# Patient Record
Sex: Male | Born: 1960 | Race: White | Hispanic: No | Marital: Married | Smoking: Never smoker
Health system: Southern US, Community
[De-identification: ages and names within clinical notes are randomized; demographics above are authoritative.]

## PROBLEM LIST (undated history)

## (undated) DIAGNOSIS — I10 Essential (primary) hypertension: Secondary | ICD-10-CM

## (undated) DIAGNOSIS — E669 Obesity, unspecified: Secondary | ICD-10-CM

---

## 2007-09-20 ENCOUNTER — Ambulatory Visit (HOSPITAL_COMMUNITY): Admission: RE | Admit: 2007-09-20 | Discharge: 2007-09-20 | Payer: Self-pay | Admitting: Surgery

## 2007-09-26 ENCOUNTER — Ambulatory Visit (HOSPITAL_COMMUNITY): Admission: RE | Admit: 2007-09-26 | Discharge: 2007-09-26 | Payer: Self-pay | Admitting: Surgery

## 2007-09-26 ENCOUNTER — Encounter: Admission: RE | Admit: 2007-09-26 | Discharge: 2007-09-26 | Payer: Self-pay | Admitting: Surgery

## 2007-11-12 ENCOUNTER — Ambulatory Visit (HOSPITAL_COMMUNITY): Admission: RE | Admit: 2007-11-12 | Discharge: 2007-11-12 | Payer: Self-pay | Admitting: Surgery

## 2007-12-18 ENCOUNTER — Encounter: Admission: RE | Admit: 2007-12-18 | Discharge: 2008-03-17 | Payer: Self-pay | Admitting: Surgery

## 2008-01-01 ENCOUNTER — Ambulatory Visit (HOSPITAL_COMMUNITY): Admission: RE | Admit: 2008-01-01 | Discharge: 2008-01-02 | Payer: Self-pay | Admitting: Surgery

## 2009-04-04 ENCOUNTER — Emergency Department: Payer: Self-pay | Admitting: Unknown Physician Specialty

## 2011-01-11 NOTE — Op Note (Signed)
Justin Floyd, Justin Floyd              ACCOUNT NO.:  [id number]   MEDICAL RECORD NO.:  [id number]          PATIENT TYPE:  OIB   LOCATION:  0098                         FACILITY:  [facility]   PHYSICIAN:  Thornton Park. Daphine Deutscher, MD  DATE OF BIRTH:  12-06-1960   DATE OF PROCEDURE:  01/01/2008  DATE OF DISCHARGE:                               OPERATIVE REPORT   PREOPERATIVE DIAGNOSIS:  Morbid obesity, body mass index 41.   POSTOPERATIVE DIAGNOSIS:  Morbid obesity, body mass index 41.   PROCEDURE:  Lap band APS system with upper endoscopy.   DESCRIPTION OF PROCEDURE:  Justin Floyd is a 50 year old white male from  [place] who was taken to OR #1 and given general anesthesia.  The  abdomen was prepped with Techni-Care and draped sterilely.   I entered the abdomen through the left upper quadrant using the Optiview  technique without difficulty.  Once inside and insufflated, I passed  standard ports including a 15 in the right upper midline, an 11 down  from that, an 11 off to the left the umbilicus with the scope, and then  I got the Nathanson retractor in good position to expose the foregut and  secured it to the iron man.  I went and dissected over on the left crus  first and then went over on the right side and dissected at the fat  stripe.  The band passer was passed from the inferior port on the right,  and it came out and I could see the tip, but I did not like the way it  looked necessarily after I passed the APS tubing when it seemed to meet  some resistance.  I got Dr. Ezzard Standing to endoscope the patient.  He passed  the scope down, and we did not see any evidence of any esophageal or  gastric injury.  I then redirected this a little more inferior, and it  became up again, again seeing the tip of the 10-mm and 11-mm band  passing device.  With that, the endoscope had been withdrawn into the  proximal esophagus, so we went ahead and used that location.  The APS  band was then inserted into the  abdomen, threaded through the band  passer, and brought around.  It was then snapped in place and held down  toward the right foot.  Within that location, I was able to plicate the  stomach over the band anteriorly to the proximal small virtual pouch,  and that was done using 2-0 Surgidac and tie knots.  There was a little  bleeding that occurred with a second suture, but that abated, and I  irrigated well.  I had also immersed the esophagogastric junction under  saline while Dr. Ezzard Standing was endoscoping to look for bubbles, and none  were seen.   Dr. Ezzard Standing then went up above again and passed the scope, and we were  able to see the mucosa and the proximal esophagus, the distal esophagus  across the EG junction, and on into the stomach as the scope passed  easily.  We withdrew the air.  There was no  evidence of any mucosal  injury or any thing at all.  The abdomen was deflated and the scope was  withdrawn.  I then brought the tubing out and brought it to the right  lower port.  I cut off the tip and then allowed the excess fluid to  escape before connecting it to the port.  This was then sutured to the  fascia with interrupted 2-0 Prolenes spaced out and then directing it  medially down on the fascia.   The patient tolerated the procedure well.  The wounds were all  irrigated, injected with Marcaine, and closed with 4-0 Vicryl with  Benzoin and Steri-Strips.  The patient was taken to the recovery room in  satisfactory condition.      Thornton Park Daphine Deutscher, MD  Electronically Signed     MBM/MEDQ  D:  01/01/2008  T:  01/01/2008  Job:  [id number]   cc:   Justin Floyd  Fax: [phone]

## 2011-07-05 ENCOUNTER — Telehealth (INDEPENDENT_AMBULATORY_CARE_PROVIDER_SITE_OTHER): Payer: Self-pay | Admitting: Surgery

## 2011-07-05 NOTE — Telephone Encounter (Signed)
07/05/11 recall mailed for bariatric surgery follow-up. Adv pt to call [facility] to schedule an appt...cef °

## 2012-08-30 ENCOUNTER — Telehealth (INDEPENDENT_AMBULATORY_CARE_PROVIDER_SITE_OTHER): Payer: Self-pay | Admitting: Surgery

## 2012-08-30 NOTE — Telephone Encounter (Signed)
07/25/12 mailed recall letter for bariatric surgery follow-up to pt. Advised pt to call [facility] at [phone] to °schedule appt. (lss) ° °

## 2016-08-02 ENCOUNTER — Encounter (HOSPITAL_COMMUNITY): Payer: Self-pay

## 2016-08-09 ENCOUNTER — Telehealth (HOSPITAL_COMMUNITY): Payer: Self-pay

## 2016-08-09 NOTE — Telephone Encounter (Signed)
This patient is overdue for recommended follow-up with a bariatric surgeon at [facility]. A letter was mailed to the address on file 08/02/16 from both [facility] in attempt to reestablish post-op care. Letter has been returned to [employer] marked undeliverable, unable to forward. No additional address on file in [facility] or [employer]. Information was shared with Mallory Shirk today at [place] so she may contact the patient via phone in attempt to get the patient scheduled for an appointment in their office.

## 2016-10-14 ENCOUNTER — Encounter: Payer: Self-pay | Admitting: *Deleted

## 2016-10-17 ENCOUNTER — Ambulatory Visit
Admission: RE | Admit: 2016-10-17 | Discharge: 2016-10-17 | Disposition: A | Discharge status: Home / Self Care | Payer: BC Managed Care – PPO | Source: Ambulatory Visit | Attending: Gastroenterology | Admitting: Gastroenterology

## 2016-10-17 ENCOUNTER — Encounter
Admission: RE | Disposition: A | Discharge status: Home / Self Care | Payer: Self-pay | Source: Ambulatory Visit | Attending: Gastroenterology

## 2016-10-17 ENCOUNTER — Ambulatory Visit: Payer: BC Managed Care – PPO | Admitting: Anesthesiology

## 2016-10-17 DIAGNOSIS — I10 Essential (primary) hypertension: Secondary | ICD-10-CM | POA: Diagnosis not present

## 2016-10-17 DIAGNOSIS — E669 Obesity, unspecified: Secondary | ICD-10-CM | POA: Diagnosis not present

## 2016-10-17 DIAGNOSIS — Z880 Allergy status to penicillin: Secondary | ICD-10-CM | POA: Diagnosis not present

## 2016-10-17 DIAGNOSIS — Z1211 Encounter for screening for malignant neoplasm of colon: Secondary | ICD-10-CM | POA: Insufficient documentation

## 2016-10-17 DIAGNOSIS — K621 Rectal polyp: Secondary | ICD-10-CM | POA: Insufficient documentation

## 2016-10-17 DIAGNOSIS — D120 Benign neoplasm of cecum: Secondary | ICD-10-CM | POA: Insufficient documentation

## 2016-10-17 DIAGNOSIS — Z6836 Body mass index (BMI) 36.0-36.9, adult: Secondary | ICD-10-CM | POA: Insufficient documentation

## 2016-10-17 HISTORY — DX: Obesity, unspecified: E66.9

## 2016-10-17 HISTORY — DX: Essential (primary) hypertension: I10

## 2016-10-17 HISTORY — PX: COLONOSCOPY WITH PROPOFOL: SHX5780

## 2016-10-17 SURGERY — COLONOSCOPY WITH PROPOFOL
Anesthesia: General

## 2016-10-17 MED ORDER — SODIUM CHLORIDE 0.9 % IV SOLN: INTRAVENOUS | Status: DC

## 2016-10-17 MED ORDER — PROPOFOL 10 MG/ML IV BOLUS
INTRAVENOUS | Status: DC | PRN
  Administered 2016-10-17: 30 mg via INTRAVENOUS
  Administered 2016-10-17: 70 mg via INTRAVENOUS

## 2016-10-17 MED ORDER — LIDOCAINE HCL (PF) 2 % IJ SOLN
INTRAMUSCULAR | Status: DC | PRN
  Administered 2016-10-17: 80 mg via INTRADERMAL

## 2016-10-17 MED ORDER — LIDOCAINE HCL (PF) 2 % IJ SOLN
INTRAMUSCULAR | Status: AC
  Filled 2016-10-17: qty 2

## 2016-10-17 MED ORDER — PROPOFOL 500 MG/50ML IV EMUL
INTRAVENOUS | Status: DC | PRN
  Administered 2016-10-17: 125 ug/kg/min via INTRAVENOUS

## 2016-10-17 MED ORDER — SODIUM CHLORIDE 0.9 % IV SOLN
INTRAVENOUS | Status: DC
  Administered 2016-10-17: 13:00:00 via INTRAVENOUS

## 2016-10-17 MED ORDER — PROPOFOL 10 MG/ML IV BOLUS
INTRAVENOUS | Status: AC
  Filled 2016-10-17: qty 20

## 2016-10-17 NOTE — Anesthesia Post-op Follow-up Note (Cosign Needed)
Anesthesia QCDR form completed.        

## 2016-10-17 NOTE — Transfer of Care (Signed)
Immediate Anesthesia Transfer of Care Note  Patient: Justin Floyd  Procedure(s) Performed: Procedure(s): COLONOSCOPY WITH PROPOFOL (N/A)  Patient Location: PACU  Anesthesia Type:General  Level of Consciousness: sedated and responds to stimulation  Airway & Oxygen Therapy: Patient Spontanous Breathing and Patient connected to nasal cannula oxygen  Post-op Assessment: Report given to RN and Post -op Vital signs reviewed and stable  Post vital signs: Reviewed and stable  Last Vitals:  Vitals:   10/17/16 1253 10/17/16 1429  BP: (!) 172/97 130/83  Pulse: 73 68  Resp: 20 16  Temp: 37.2 C     Last Pain:  Vitals:   10/17/16 1253  TempSrc: Tympanic         Complications: No apparent anesthesia complications

## 2016-10-17 NOTE — Anesthesia Procedure Notes (Signed)
Performed by: Dior Dominik Pre-anesthesia Checklist: Patient identified, Emergency Drugs available, Suction available, Patient being monitored and Timeout performed Patient Re-evaluated:Patient Re-evaluated prior to inductionOxygen Delivery Method: Nasal cannula Preoxygenation: Pre-oxygenation with 100% oxygen Intubation Type: IV induction       

## 2016-10-17 NOTE — Anesthesia Postprocedure Evaluation (Signed)
Anesthesia Post Note  Patient: Justin Floyd  Procedure(s) Performed: Procedure(s) (LRB): COLONOSCOPY WITH PROPOFOL (N/A)  Patient location during evaluation: Endoscopy Anesthesia Type: General Level of consciousness: awake and alert and oriented Pain management: pain level controlled Vital Signs Assessment: post-procedure vital signs reviewed and stable Respiratory status: spontaneous breathing, nonlabored ventilation and respiratory function stable Cardiovascular status: blood pressure returned to baseline and stable Postop Assessment: no signs of nausea or vomiting Anesthetic complications: no     Last Vitals:  Vitals:   10/17/16 1448 10/17/16 1458  BP: (!) 147/92 (!) 159/90  Pulse: 66 68  Resp: 13 16  Temp:      Last Pain:  Vitals:   10/17/16 1428  TempSrc: Tympanic  PainSc: Asleep                 [name]

## 2016-10-17 NOTE — H&P (Signed)
Outpatient short stay form Pre-procedure 10/17/2016 1:47 PM Lollie Sails MD  Primary Physician: Dr Meriel Flavors  Reason for visit:  Colonoscopy  History of present illness:  Patient is a 56 year old male presenting today as above. He has not had a colonoscopy in the past. This is for screening purposes. He tolerated his prep well. He takes no aspirin or blood thinning agents.    Current Facility-Administered Medications:  .  0.9 %  sodium chloride infusion, , Intravenous, Continuous, Lollie Sails, MD .  0.9 %  sodium chloride infusion, , Intravenous, Continuous, Lollie Sails, MD  Prescriptions Prior to Admission  Medication Sig Dispense Refill Last Dose  . lisinopril-hydrochlorothiazide (PRINZIDE,ZESTORETIC) 20-12.5 MG tablet Take 1 tablet by mouth daily.     . Testosterone 12.5 MG/ACT (1%) GEL Place onto the skin.        Allergies  Allergen Reactions  . Penicillins Itching     Past Medical History:  Diagnosis Date  . Hypertension   . Obesity     Review of systems:      Physical Exam    Heart and lungs: Regular rate and rhythm without rub or gallop, lungs are bilaterally clear.    HEENT: Normocephalic atraumatic eyes are anicteric    Other:     Pertinant exam for procedure: Soft nontender nondistended bowel sounds positive normoactive. Note there is a well-healed incision in the epigastric region consistent with his previous history of lap band.    Planned proceedures: Colonoscopy and indicated procedures. I have discussed the risks benefits and complications of procedures to include not limited to bleeding, infection, perforation and the risk of sedation and the patient wishes to proceed.    Lollie Sails, MD Gastroenterology 10/17/2016  1:47 PM

## 2016-10-17 NOTE — OR Nursing (Addendum)
Both cold snare polypectomy site edges cleaned up with cold forcep.

## 2016-10-17 NOTE — Op Note (Signed)
[facility] Gastroenterology Patient Name: Justin Floyd Procedure Date: 10/17/2016 1:50 PM MRN: [id number] Account #: [id number] Date of Birth: 1961/08/23 Admit Type: Outpatient Age: 56 Room: [facility] ENDO ROOM 3 Gender: Male Note Status: Finalized Procedure:            Colonoscopy Indications:          Screening for colorectal malignant neoplasm Providers:            Lollie Sails, MD Referring MD:         Dion Body (Referring MD) Medicines:            Monitored Anesthesia Care Complications:        No immediate complications. Procedure:            Pre-Anesthesia Assessment:                       - ASA Grade Assessment: II - A patient with mild                        systemic disease.                       After obtaining informed consent, the colonoscope was                        passed under direct vision. Throughout the procedure,                        the patient's blood pressure, pulse, and oxygen                        saturations were monitored continuously. The                        Colonoscope was introduced through the anus and                        advanced to the the cecum, identified by appendiceal                        orifice and ileocecal valve. The colonoscopy was                        performed without difficulty. The patient tolerated the                        procedure well. The quality of the bowel preparation                        was fair. Findings:      A 8 mm polyp was found in the cecum. The polyp was sessile. The polyp       was removed with a cold snare and cold forcep. Resection and retrieval       were complete.      A 7 mm polyp was found in the rectum. The polyp was sessile. The polyp       was removed with a cold biopsy forceps. Resection and retrieval were       complete.      The digital rectal exam was normal.      The retroflexed view of the distal  rectum and anal verge was normal and       showed no  anal or rectal abnormalities. Impression:           - Preparation of the colon was fair.                       - One 8 mm polyp in the cecum, removed with a cold                        snare. Resected and retrieved.                       - One 7 mm polyp in the rectum, removed with a cold                        biopsy forceps. Resected and retrieved.                       - The distal rectum and anal verge are normal on                        retroflexion view. Recommendation:       - Discharge patient to home. Procedure Code(s):    --- Professional ---                       [phone], Colonoscopy, flexible; with removal of tumor(s),                        polyp(s), or other lesion(s) by snare technique                       45380, 100, Colonoscopy, flexible; with biopsy, single                        or multiple Diagnosis Code(s):    --- Professional ---                       Z12.11, Encounter for screening for malignant neoplasm                        of colon                       D12.0, Benign neoplasm of cecum                       K62.1, Rectal polyp CPT copyright 2016 American Medical Association. All rights reserved. The codes documented in this report are preliminary and upon coder review may  be revised to meet current compliance requirements. Lollie Sails, MD 10/17/2016 2:28:07 PM This report has been signed electronically. Number of Addenda: 0 Note Initiated On: 10/17/2016 1:50 PM Scope Withdrawal Time: 0 hours 19 minutes 13 seconds  Total Procedure Duration: 0 hours 26 minutes 14 seconds       [facility]

## 2016-10-17 NOTE — Anesthesia Preprocedure Evaluation (Signed)
Anesthesia Evaluation  Patient identified by MRN, date of birth, ID band Patient awake    Reviewed: Allergy & Precautions, NPO status , Patient's Chart, lab work & pertinent test results  History of Anesthesia Complications Negative for: history of anesthetic complications  Airway Mallampati: IV  TM Distance: >3 FB Neck ROM: Full    Dental no notable dental hx.    Pulmonary neg pulmonary ROS, neg sleep apnea, neg COPD,    breath sounds clear to auscultation- rhonchi (-) wheezing      Cardiovascular Exercise Tolerance: Good hypertension, Pt. on medications (-) CAD and (-) Past MI  Rhythm:Regular Rate:Normal - Systolic murmurs and - Diastolic murmurs    Neuro/Psych negative neurological ROS  negative psych ROS   GI/Hepatic negative GI ROS, Neg liver ROS,   Endo/Other  negative endocrine ROSneg diabetes  Renal/GU negative Renal ROS     Musculoskeletal negative musculoskeletal ROS (+)   Abdominal (+) + obese,   Peds  Hematology negative hematology ROS (+)   Anesthesia Other Findings Past Medical History: No date: Hypertension No date: Obesity   Reproductive/Obstetrics                             Anesthesia Physical Anesthesia Plan  ASA: II  Anesthesia Plan: General   Post-op Pain Management:    Induction: Intravenous  Airway Management Planned: Natural Airway  Additional Equipment:   Intra-op Plan:   Post-operative Plan:   Informed Consent: I have reviewed the patients History and Physical, chart, labs and discussed the procedure including the risks, benefits and alternatives for the proposed anesthesia with the patient or authorized representative who has indicated his/her understanding and acceptance.   Dental advisory given  Plan Discussed with: CRNA and Anesthesiologist  Anesthesia Plan Comments:         Anesthesia Quick Evaluation

## 2016-10-18 ENCOUNTER — Encounter: Payer: Self-pay | Admitting: Gastroenterology

## 2016-10-19 LAB — SURGICAL PATHOLOGY

## 2017-05-22 ENCOUNTER — Other Ambulatory Visit: Payer: Self-pay | Admitting: Neurosurgery

## 2017-05-22 DIAGNOSIS — M5440 Lumbago with sciatica, unspecified side: Secondary | ICD-10-CM

## 2017-05-24 ENCOUNTER — Other Ambulatory Visit: Payer: Self-pay | Admitting: Neurosurgery

## 2017-05-24 DIAGNOSIS — M545 Low back pain: Principal | ICD-10-CM

## 2017-05-24 DIAGNOSIS — G8929 Other chronic pain: Secondary | ICD-10-CM

## 2017-05-25 ENCOUNTER — Ambulatory Visit: Payer: BC Managed Care – PPO

## 2017-06-03 ENCOUNTER — Ambulatory Visit
Admission: RE | Admit: 2017-06-03 | Discharge: 2017-06-03 | Disposition: A | Discharge status: Home / Self Care | Payer: BC Managed Care – PPO | Source: Ambulatory Visit | Attending: Neurosurgery | Admitting: Neurosurgery

## 2017-06-03 DIAGNOSIS — M545 Low back pain: Principal | ICD-10-CM

## 2017-06-03 DIAGNOSIS — G8929 Other chronic pain: Secondary | ICD-10-CM

## 2017-08-03 ENCOUNTER — Encounter (HOSPITAL_COMMUNITY): Payer: Self-pay

## 2019-01-10 IMAGING — MR MR LUMBAR SPINE W/O CM
4 of 5 series · 26 of 48 positions shown · non-contrast
Comparison: Lumbar spine radiograph 04/25/2017

CLINICAL DATA: Chronic low back pain

EXAM:
MRI LUMBAR SPINE WITHOUT CONTRAST
TECHNIQUE: Multiplanar, multisequence MR imaging of the lumbar spine was
performed. No intravenous contrast was administered.

[Series 3: T2 post-contrast · sagittal · 4.0mm · 0.55mm/px · 6 of 13 slices shown]
[im 1/13]
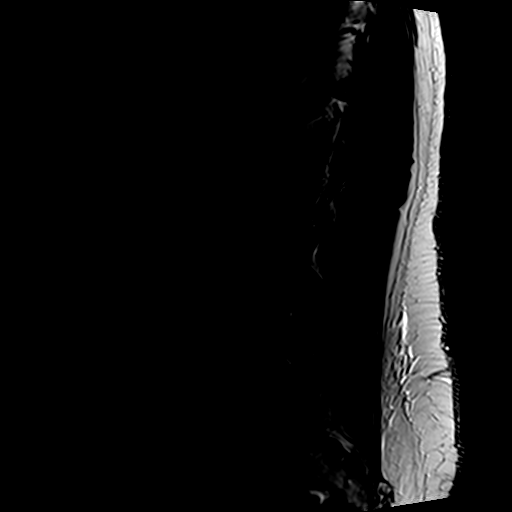
[im 3/13]
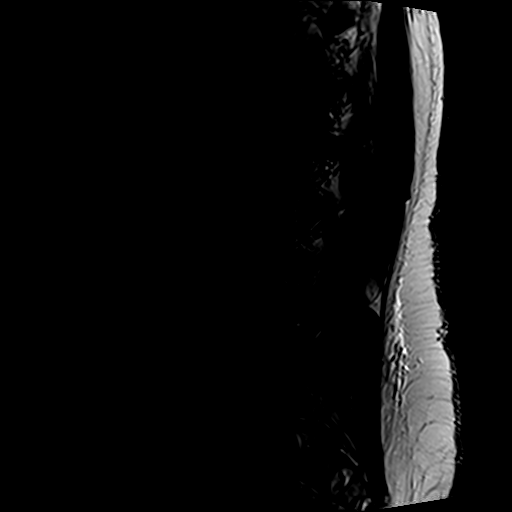
[im 5/13]
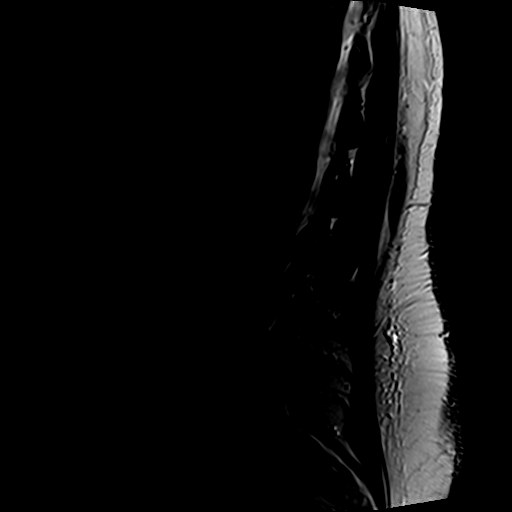
[im 8/13]
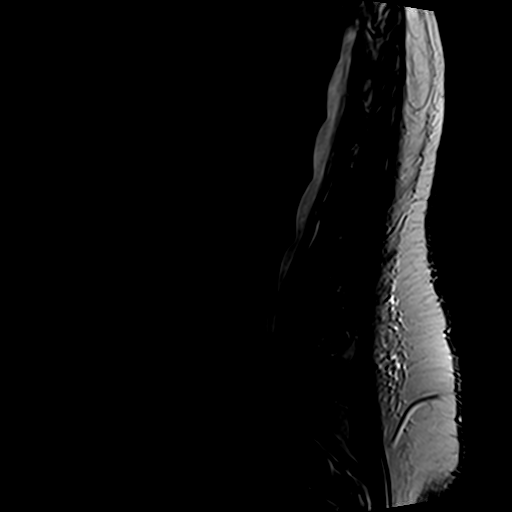
[im 10/13]
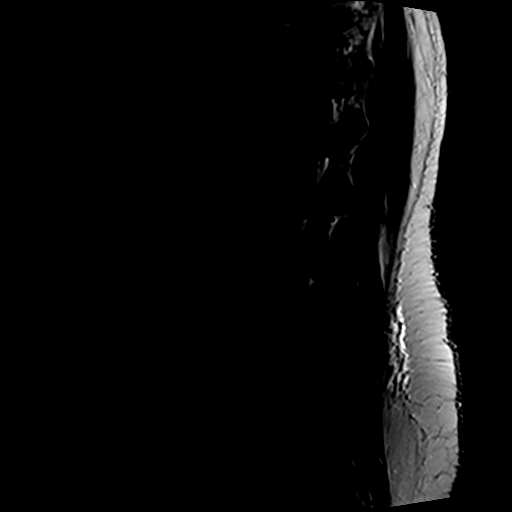
[im 13/13]
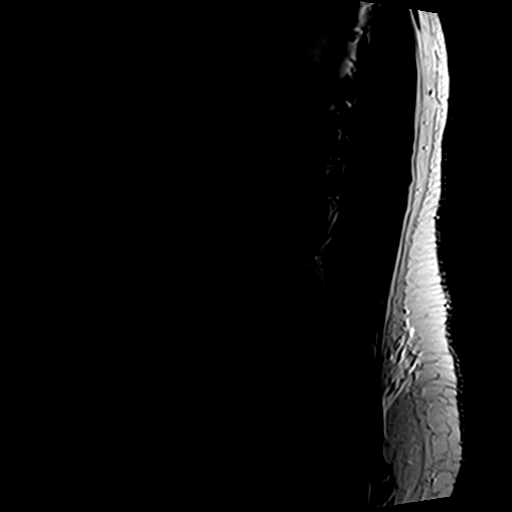

[Series 5: T1 · sagittal · 4.0mm · 0.55mm/px · 6 of 13 slices shown (1 of 2)]
[im 1/13]
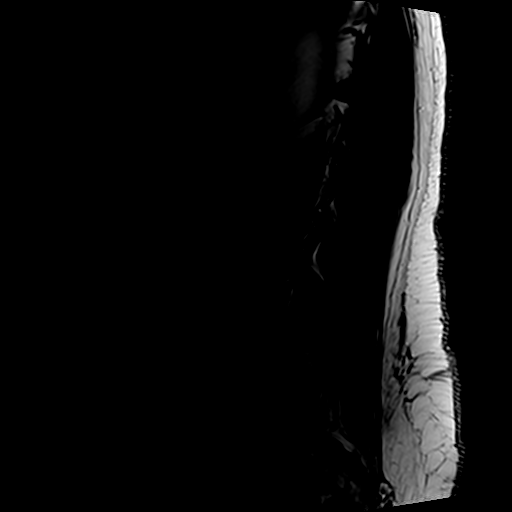
[im 3/13]
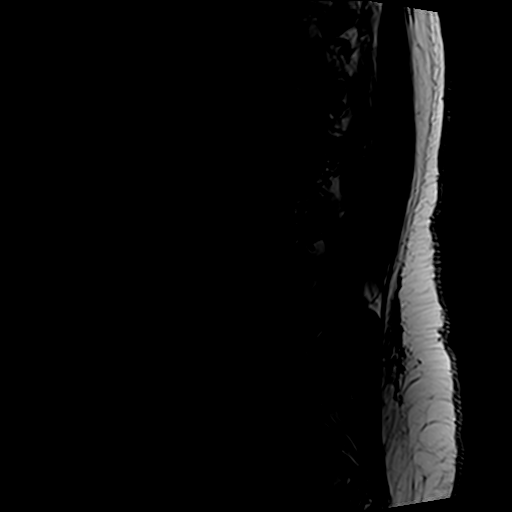
[im 5/13]
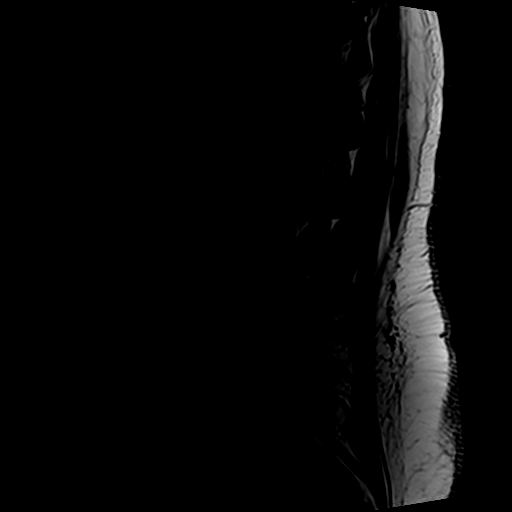
[im 8/13]
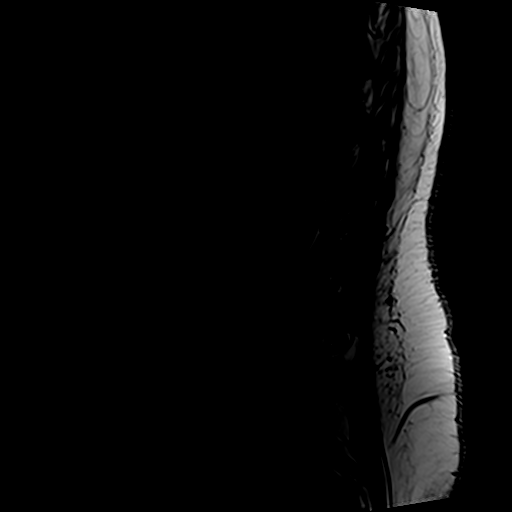
[im 10/13]
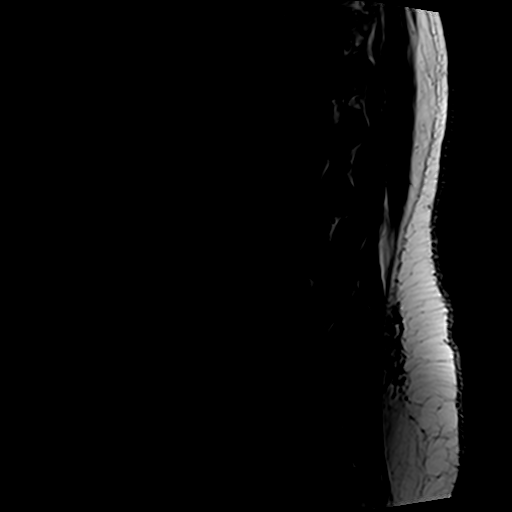
[im 13/13]
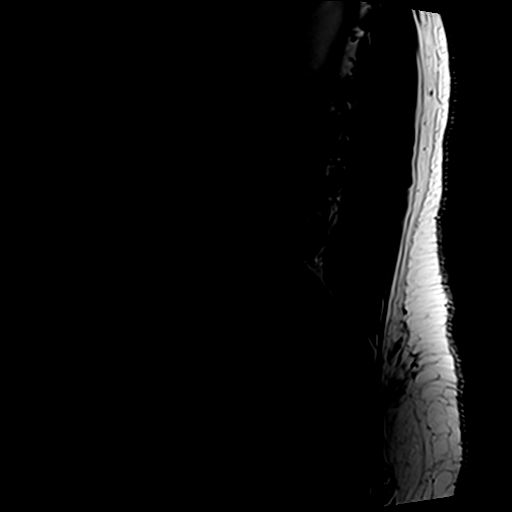

[Series 6: T1 · axial · 4.0mm · 0.35mm/px · z∈[-77,+91]mm · 5 of 35 slices shown (2 of 2)]
[im 1/35]
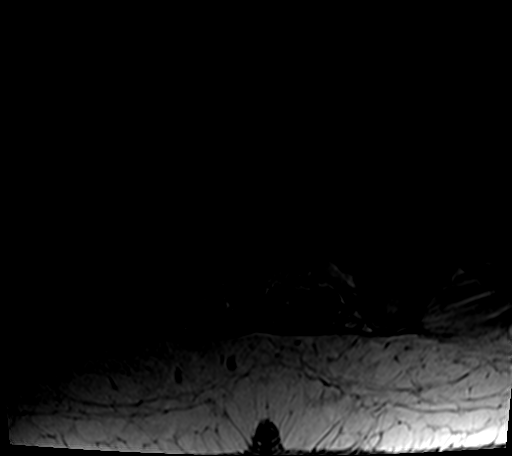
[im 5/35]
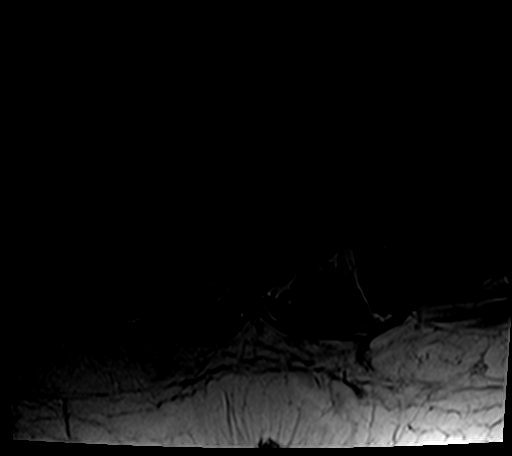
[im 10/35]
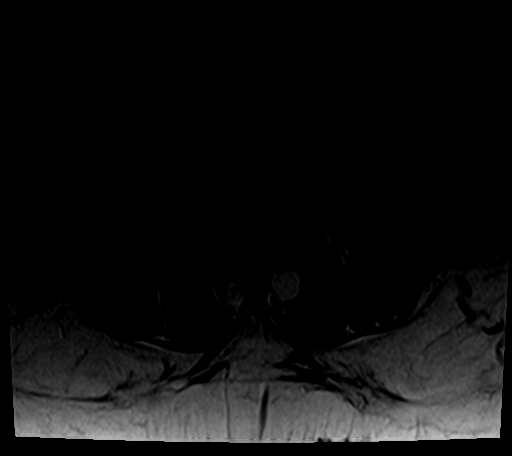
[im 18/35]
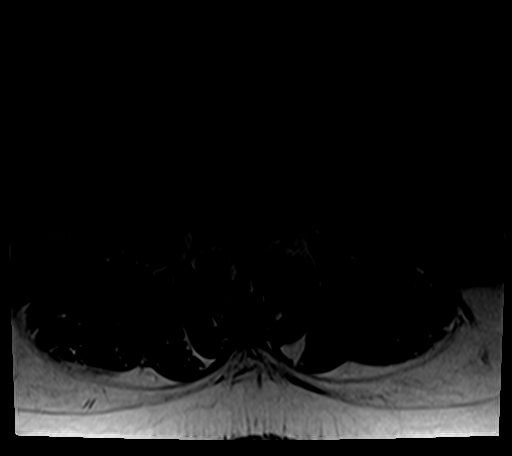
[im 30/35]
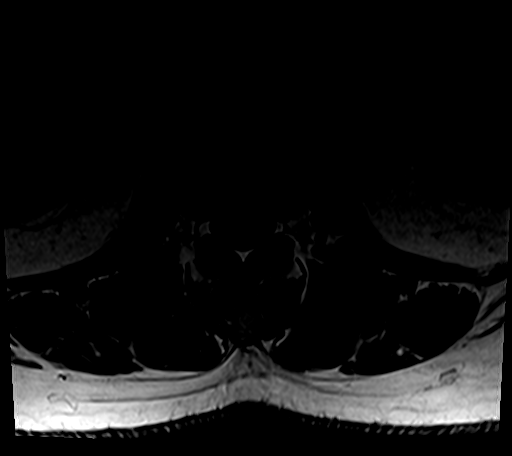

[Series 7: T2 · axial · 4.0mm · 0.70mm/px · z∈[-77,+130]mm · 9 of 35 slices shown]
[im 1/35]
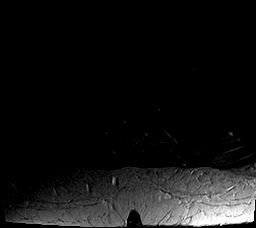
[im 5/35]
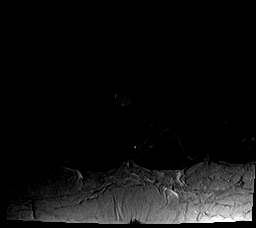
[im 10/35]
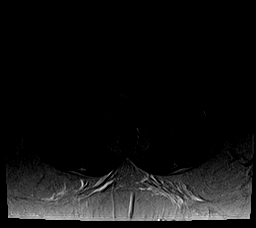
[im 15/35]
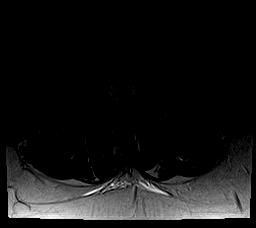
[im 18/35]
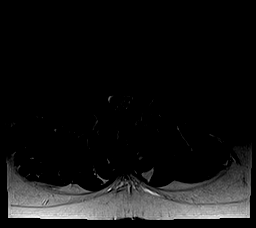
[im 20/35]
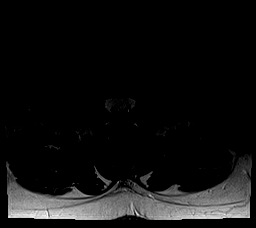
[im 25/35]
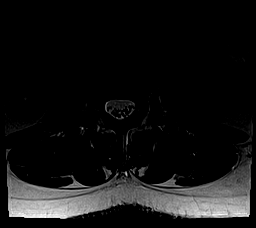
[im 30/35]
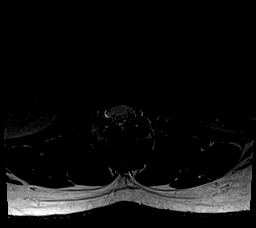
[im 35/35]
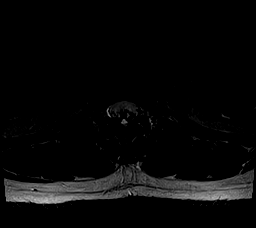

[26 of 48 positions shown; findings below may reference images not displayed]

FINDINGS: Segmentation:  Standard.

Alignment:  Physiologic.

Vertebrae:  No fracture, evidence of discitis, or bone lesion.

Conus medullaris: Extends to the L1 level and appears normal.

Paraspinal and other soft tissues: Negative.

Disc levels:

Above the T12 level, there is no disc herniation, spinal canal
stenosis or neuroforaminal stenosis.

T12-L1: Normal disc space and facets. No spinal canal or
neuroforaminal stenosis.

L1-L2: Normal disc space and facets. No spinal canal or
neuroforaminal stenosis.

L2-L3: Left eccentric disc bulge with mild narrowing of the left
lateral recess. No spinal canal or neural foraminal stenosis. Normal
facets.

L3-L4: Disc desiccation and mild bulge. Normal facets. No stenosis.

L4-L5: Disc desiccation and mild bulge. Mild facet hypertrophy. Mild
bilateral neural foraminal stenosis. No spinal canal stenosis.

L5-S1: Disc space narrowing with mild bulge. Mild right neural
foraminal stenosis. No spinal canal stenosis.

Visualized sacrum: Normal.
IMPRESSION: 1. Mild multilevel lumbar degenerative disc disease with mild neural
foraminal stenosis at right L5-S1 and bilateral L4-L5.
2. No lumbar spinal canal stenosis.
3. Narrowing of the left lateral recess at L2-L3. Correlate for left
L3 radiculopathy.

## 2020-10-08 ENCOUNTER — Ambulatory Visit: Payer: Self-pay | Admitting: Urology

## 2022-05-13 ENCOUNTER — Emergency Department (HOSPITAL_COMMUNITY): Payer: BC Managed Care – PPO

## 2022-05-13 ENCOUNTER — Emergency Department (HOSPITAL_COMMUNITY)
Admission: EM | Admit: 2022-05-13 | Discharge: 2022-05-14 | Disposition: A | Discharge status: Home / Self Care | Payer: BC Managed Care – PPO | Attending: Emergency Medicine | Admitting: Emergency Medicine

## 2022-05-13 DIAGNOSIS — Y92410 Unspecified street and highway as the place of occurrence of the external cause: Secondary | ICD-10-CM | POA: Insufficient documentation

## 2022-05-13 DIAGNOSIS — S20212A Contusion of left front wall of thorax, initial encounter: Secondary | ICD-10-CM | POA: Diagnosis not present

## 2022-05-13 DIAGNOSIS — R109 Unspecified abdominal pain: Secondary | ICD-10-CM | POA: Diagnosis not present

## 2022-05-13 DIAGNOSIS — S299XXA Unspecified injury of thorax, initial encounter: Secondary | ICD-10-CM | POA: Diagnosis present

## 2022-05-13 DIAGNOSIS — S20219A Contusion of unspecified front wall of thorax, initial encounter: Secondary | ICD-10-CM

## 2022-05-13 LAB — COMPREHENSIVE METABOLIC PANEL
ALT: 19 U/L (ref 0–44)
AST: 26 U/L (ref 15–41)
Albumin: 4.3 g/dL (ref 3.5–5.0)
Alkaline Phosphatase: 48 U/L (ref 38–126)
Anion gap: 12 (ref 5–15)
BUN: 19 mg/dL (ref 8–23)
CO2: 22 mmol/L (ref 22–32)
Calcium: 9.5 mg/dL (ref 8.9–10.3)
Chloride: 102 mmol/L (ref 98–111)
Creatinine, Ser: 1.07 mg/dL (ref 0.61–1.24)
GFR, Estimated: >60 mL/min (ref >60)
Glucose, Bld: 106 mg/dL — ABNORMAL HIGH (ref 70–99)
Potassium: 3.5 mmol/L (ref 3.5–5.1)
Sodium: 136 mmol/L (ref 135–145)
Total Bilirubin: 0.3 mg/dL (ref 0.3–1.2)
Total Protein: 6.4 g/dL — ABNORMAL LOW (ref 6.5–8.1)

## 2022-05-13 LAB — CBC WITH DIFFERENTIAL/PLATELET
Abs Immature Granulocytes: 0.06 10*3/uL (ref 0.00–0.07)
Basophils Absolute: 0 10*3/uL (ref 0.0–0.1)
Basophils Relative: 0 %
Eosinophils Absolute: 0.1 10*3/uL (ref 0.0–0.5)
Eosinophils Relative: 1 %
HCT: 44.3 % (ref 39.0–52.0)
Hemoglobin: 16.2 g/dL (ref 13.0–17.0)
Immature Granulocytes: 1 %
Lymphocytes Relative: 16 %
Lymphs Abs: 1.6 10*3/uL (ref 0.7–4.0)
MCH: 31.6 pg (ref 26.0–34.0)
MCHC: 36.6 g/dL — ABNORMAL HIGH (ref 30.0–36.0)
MCV: 86.4 fL (ref 80.0–100.0)
Monocytes Absolute: 0.7 10*3/uL (ref 0.1–1.0)
Monocytes Relative: 7 %
Neutro Abs: 7.2 10*3/uL (ref 1.7–7.7)
Neutrophils Relative %: 75 %
Platelets: 220 10*3/uL (ref 150–400)
RBC: 5.13 MIL/uL (ref 4.22–5.81)
RDW: 13.2 % (ref 11.5–15.5)
WBC: 9.7 10*3/uL (ref 4.0–10.5)
nRBC: 0 % (ref 0.0–0.2)

## 2022-05-13 LAB — PROTIME-INR
INR: 1 (ref 0.8–1.2)
Prothrombin Time: 12.8 seconds (ref 11.4–15.2)

## 2022-05-13 LAB — I-STAT CHEM 8, ED
BUN: 18 mg/dL (ref 8–23)
Calcium, Ion: 1.11 mmol/L — ABNORMAL LOW (ref 1.15–1.40)
Chloride: 102 mmol/L (ref 98–111)
Creatinine, Ser: 0.9 mg/dL (ref 0.61–1.24)
Glucose, Bld: 104 mg/dL — ABNORMAL HIGH (ref 70–99)
HCT: 45 % (ref 39.0–52.0)
Hemoglobin: 15.3 g/dL (ref 13.0–17.0)
Potassium: 3.6 mmol/L (ref 3.5–5.1)
Sodium: 137 mmol/L (ref 135–145)
TCO2: 22 mmol/L (ref 22–32)

## 2022-05-13 LAB — ETHANOL: Alcohol, Ethyl (B): <10 mg/dL (ref <10)

## 2022-05-13 LAB — APTT: aPTT: 28 seconds (ref 24–36)

## 2022-05-13 MED ORDER — IOHEXOL 350 MG/ML SOLN
75.0000 mL | Freq: Once | INTRAVENOUS | Status: AC | PRN
  Administered 2022-05-13: 75 mL via INTRAVENOUS

## 2022-05-13 NOTE — ED Notes (Signed)
Patient transported to CT 
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
## 2022-05-14 MED ORDER — CYCLOBENZAPRINE HCL 10 MG PO TABS: 10.0000 mg | ORAL_TABLET | Freq: Two times a day (BID) | ORAL | 0 refills | Status: AC | PRN

## 2022-05-14 MED ORDER — IBUPROFEN 600 MG PO TABS: 600.0000 mg | ORAL_TABLET | Freq: Four times a day (QID) | ORAL | 0 refills | Status: AC | PRN
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
## 2024-01-24 ENCOUNTER — Ambulatory Visit: Payer: Self-pay

## 2024-01-24 DIAGNOSIS — Z09 Encounter for follow-up examination after completed treatment for conditions other than malignant neoplasm: Secondary | ICD-10-CM | POA: Diagnosis not present

## 2024-01-24 DIAGNOSIS — K5730 Diverticulosis of large intestine without perforation or abscess without bleeding: Secondary | ICD-10-CM | POA: Diagnosis not present

## 2024-01-24 DIAGNOSIS — Z860100 Personal history of colon polyps, unspecified: Secondary | ICD-10-CM | POA: Diagnosis not present
